# Patient Record
Sex: Female | Born: 1985 | Race: Black or African American | Hispanic: No | Marital: Single | Smoking: Current every day smoker
Health system: Southern US, Community
[De-identification: ages and names within clinical notes are randomized; demographics above are authoritative.]

## PROBLEM LIST (undated history)

## (undated) DIAGNOSIS — F4310 Post-traumatic stress disorder, unspecified: Secondary | ICD-10-CM

## (undated) DIAGNOSIS — F419 Anxiety disorder, unspecified: Secondary | ICD-10-CM

## (undated) DIAGNOSIS — F319 Bipolar disorder, unspecified: Secondary | ICD-10-CM

## (undated) DIAGNOSIS — F209 Schizophrenia, unspecified: Secondary | ICD-10-CM

---

## 2009-11-16 ENCOUNTER — Emergency Department (HOSPITAL_BASED_OUTPATIENT_CLINIC_OR_DEPARTMENT_OTHER): Admission: EM | Admit: 2009-11-16 | Discharge: 2009-11-16 | Payer: Self-pay | Admitting: Emergency Medicine

## 2009-11-16 ENCOUNTER — Ambulatory Visit: Payer: Self-pay | Admitting: Diagnostic Radiology

## 2010-10-25 LAB — BASIC METABOLIC PANEL
BUN: 8 mg/dL (ref 6–23)
Chloride: 104 mEq/L (ref 96–112)
GFR calc Af Amer: >60 mL/min (ref >60)
GFR calc non Af Amer: >60 mL/min (ref >60)
Glucose, Bld: 90 mg/dL (ref 70–99)
Potassium: 4 mEq/L (ref 3.5–5.1)

## 2010-10-25 LAB — URINE MICROSCOPIC-ADD ON

## 2010-10-25 LAB — URINALYSIS, ROUTINE W REFLEX MICROSCOPIC
Bilirubin Urine: NEGATIVE
Nitrite: NEGATIVE
Protein, ur: NEGATIVE mg/dL
pH: 6 (ref 5.0–8.0)

## 2010-10-25 LAB — PREGNANCY, URINE: Preg Test, Ur: NEGATIVE

## 2011-01-16 IMAGING — CR DG CHEST 2V
2 series · 2 of 2 positions shown · non-contrast
Comparison: None

CLINICAL DATA: Insomnia.  Appetite loss.  Diarrhea.

CHEST - 2 VIEW

[w chest pa]
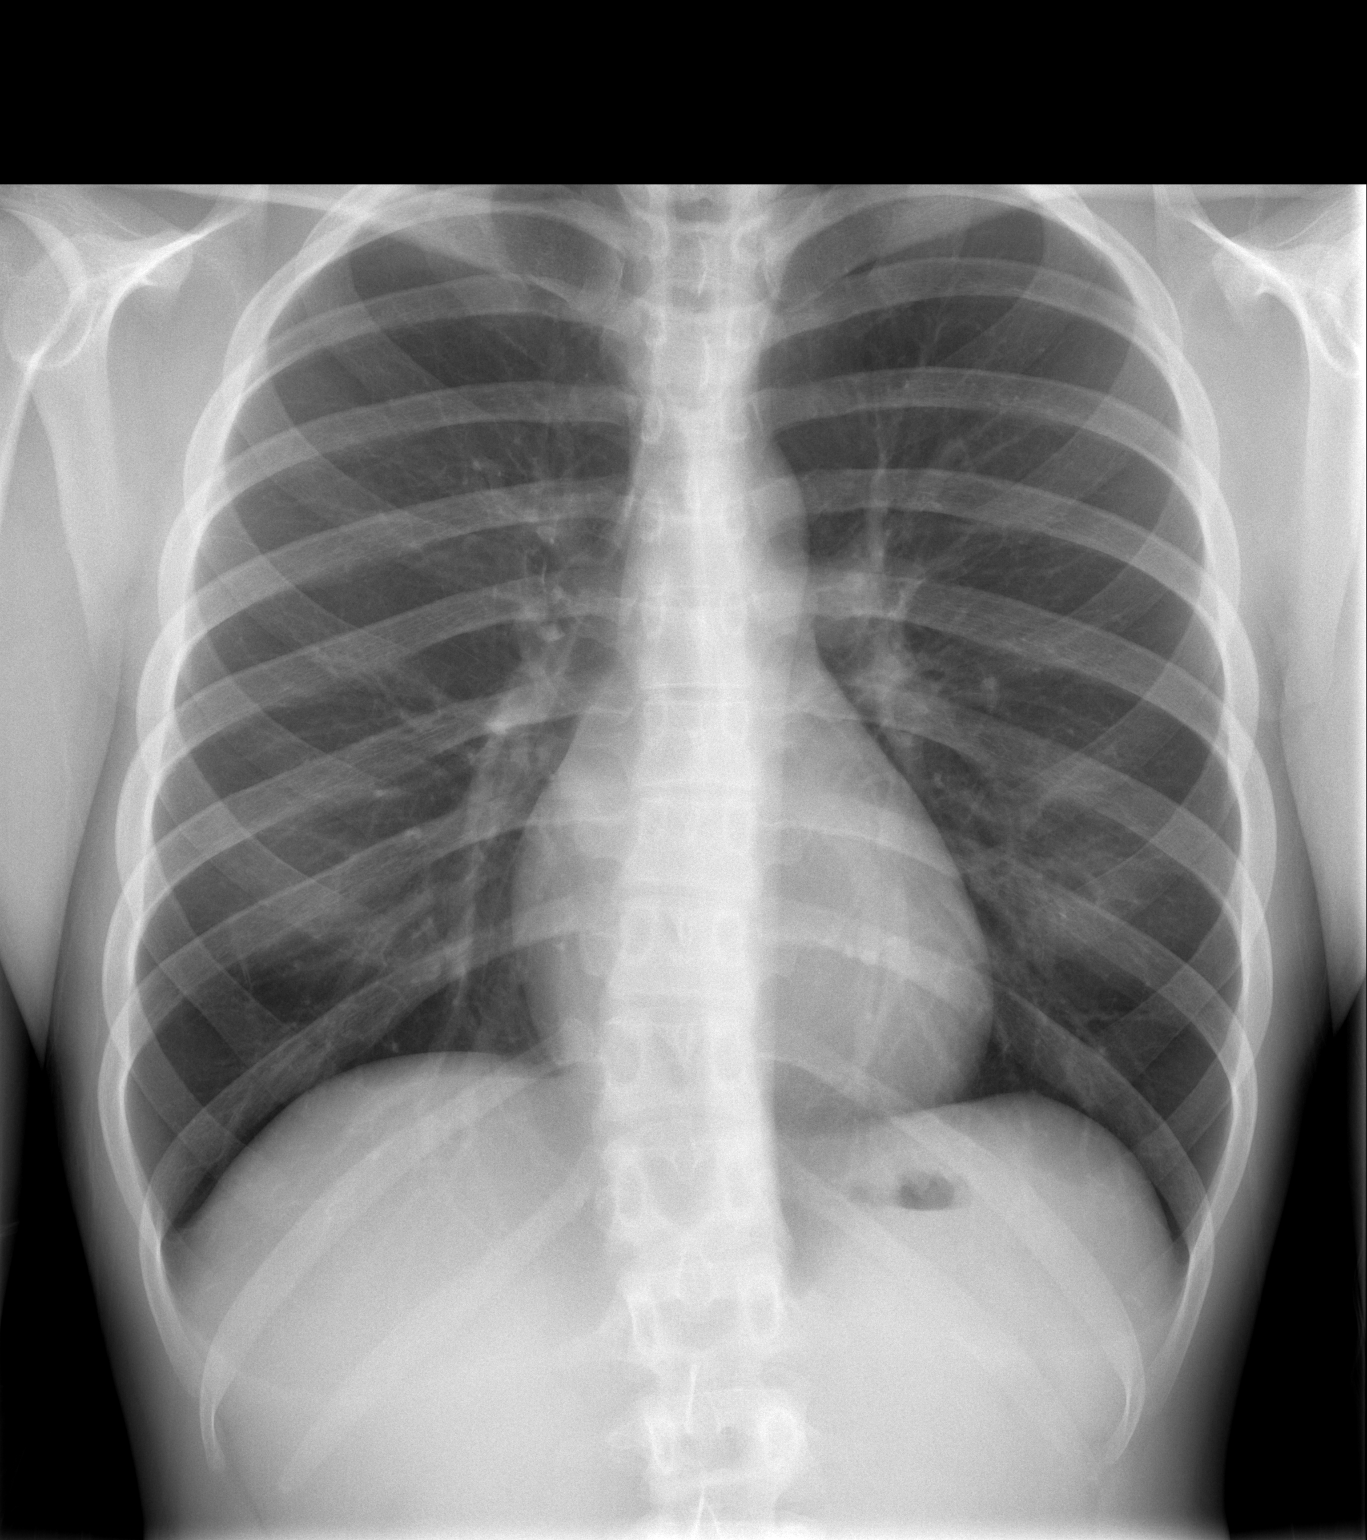

[w chest lat]
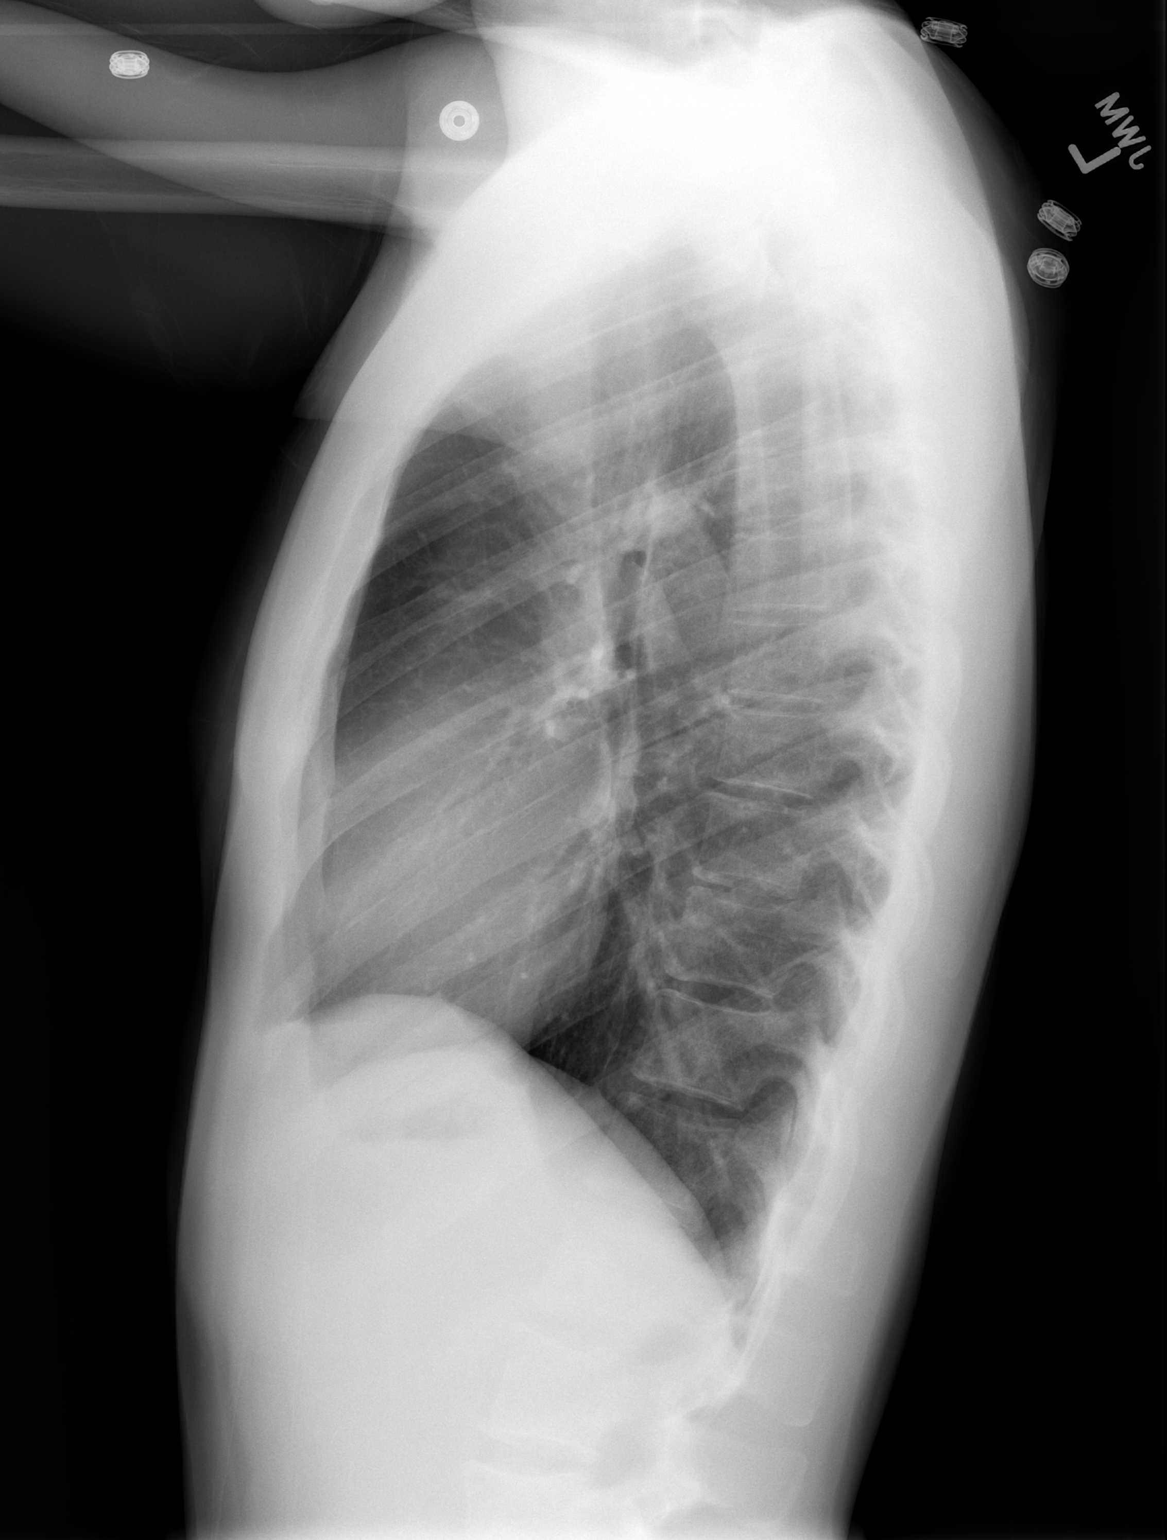

[2 of 2 positions shown; findings below may reference images not displayed]

FINDINGS: Heart size is normal.  Mediastinum is unremarkable except
for mild scoliosis of the spine.  The lungs are clear.  The
vascularity is normal.  No effusions.  No acute bony findings.
IMPRESSION: No cardiovascular or pulmonary pathology evident.  Mild scoliosis.

## 2016-08-09 ENCOUNTER — Inpatient Hospital Stay (HOSPITAL_COMMUNITY)
Admission: AD | Admit: 2016-08-09 | Discharge: 2016-08-13 | DRG: 885 | Disposition: A | Discharge status: Home / Self Care | Payer: Medicaid Other | Attending: Psychiatry | Admitting: Psychiatry

## 2016-08-09 ENCOUNTER — Emergency Department (HOSPITAL_COMMUNITY)
Admission: EM | Admit: 2016-08-09 | Discharge: 2016-08-09 | Disposition: A | Discharge status: Other Acute Inpatient Hospital | Payer: Medicaid Other | Attending: Emergency Medicine | Admitting: Emergency Medicine

## 2016-08-09 ENCOUNTER — Encounter (HOSPITAL_COMMUNITY): Payer: Self-pay | Admitting: Emergency Medicine

## 2016-08-09 ENCOUNTER — Encounter (HOSPITAL_COMMUNITY): Payer: Self-pay

## 2016-08-09 DIAGNOSIS — Z62810 Personal history of physical and sexual abuse in childhood: Secondary | ICD-10-CM | POA: Diagnosis not present

## 2016-08-09 DIAGNOSIS — R440 Auditory hallucinations: Secondary | ICD-10-CM

## 2016-08-09 DIAGNOSIS — F17210 Nicotine dependence, cigarettes, uncomplicated: Secondary | ICD-10-CM | POA: Diagnosis present

## 2016-08-09 DIAGNOSIS — Z5181 Encounter for therapeutic drug level monitoring: Secondary | ICD-10-CM | POA: Insufficient documentation

## 2016-08-09 DIAGNOSIS — F250 Schizoaffective disorder, bipolar type: Secondary | ICD-10-CM | POA: Insufficient documentation

## 2016-08-09 DIAGNOSIS — F29 Unspecified psychosis not due to a substance or known physiological condition: Secondary | ICD-10-CM

## 2016-08-09 DIAGNOSIS — R451 Restlessness and agitation: Secondary | ICD-10-CM | POA: Diagnosis present

## 2016-08-09 DIAGNOSIS — F1220 Cannabis dependence, uncomplicated: Secondary | ICD-10-CM | POA: Diagnosis present

## 2016-08-09 DIAGNOSIS — F411 Generalized anxiety disorder: Secondary | ICD-10-CM | POA: Diagnosis present

## 2016-08-09 DIAGNOSIS — R45851 Suicidal ideations: Secondary | ICD-10-CM | POA: Diagnosis present

## 2016-08-09 DIAGNOSIS — G4700 Insomnia, unspecified: Secondary | ICD-10-CM | POA: Diagnosis present

## 2016-08-09 DIAGNOSIS — Z008 Encounter for other general examination: Secondary | ICD-10-CM

## 2016-08-09 DIAGNOSIS — Z79899 Other long term (current) drug therapy: Secondary | ICD-10-CM | POA: Diagnosis not present

## 2016-08-09 DIAGNOSIS — F22 Delusional disorders: Secondary | ICD-10-CM | POA: Insufficient documentation

## 2016-08-09 HISTORY — DX: Schizophrenia, unspecified: F20.9

## 2016-08-09 HISTORY — DX: Bipolar disorder, unspecified: F31.9

## 2016-08-09 LAB — COMPREHENSIVE METABOLIC PANEL
ALBUMIN: 4.5 g/dL (ref 3.5–5.0)
ALT: 19 U/L (ref 14–54)
ANION GAP: 4 — AB (ref 5–15)
AST: 27 U/L (ref 15–41)
Alkaline Phosphatase: 56 U/L (ref 38–126)
BUN: 8 mg/dL (ref 6–20)
CO2: 27 mmol/L (ref 22–32)
Calcium: 9.2 mg/dL (ref 8.9–10.3)
Chloride: 106 mmol/L (ref 101–111)
Creatinine, Ser: 0.78 mg/dL (ref 0.44–1.00)
GFR calc Af Amer: >60 mL/min (ref >60)
GFR calc non Af Amer: >60 mL/min (ref >60)
GLUCOSE: 106 mg/dL — AB (ref 65–99)
POTASSIUM: 4.1 mmol/L (ref 3.5–5.1)
SODIUM: 137 mmol/L (ref 135–145)
Total Bilirubin: 0.3 mg/dL (ref 0.3–1.2)
Total Protein: 7.8 g/dL (ref 6.5–8.1)

## 2016-08-09 LAB — RAPID URINE DRUG SCREEN, HOSP PERFORMED
AMPHETAMINES: NOT DETECTED
BARBITURATES: NOT DETECTED
BENZODIAZEPINES: NOT DETECTED
COCAINE: NOT DETECTED
Opiates: NOT DETECTED
TETRAHYDROCANNABINOL: POSITIVE — AB

## 2016-08-09 LAB — I-STAT BETA HCG BLOOD, ED (MC, WL, AP ONLY): I-stat hCG, quantitative: <5 m[IU]/mL (ref <5)

## 2016-08-09 LAB — CBC
HEMATOCRIT: 37.9 % (ref 36.0–46.0)
HEMOGLOBIN: 12.8 g/dL (ref 12.0–15.0)
MCH: 29.6 pg (ref 26.0–34.0)
MCHC: 33.8 g/dL (ref 30.0–36.0)
MCV: 87.5 fL (ref 78.0–100.0)
Platelets: 340 10*3/uL (ref 150–400)
RBC: 4.33 MIL/uL (ref 3.87–5.11)
RDW: 13.7 % (ref 11.5–15.5)
WBC: 7 10*3/uL (ref 4.0–10.5)

## 2016-08-09 LAB — ETHANOL: Alcohol, Ethyl (B): <5 mg/dL (ref <5)

## 2016-08-09 LAB — ACETAMINOPHEN LEVEL

## 2016-08-09 LAB — SALICYLATE LEVEL: Salicylate Lvl: <7 mg/dL (ref 2.8–30.0)

## 2016-08-09 MED ORDER — ACETAMINOPHEN 325 MG PO TABS: 650.0000 mg | ORAL_TABLET | Freq: Four times a day (QID) | ORAL | Status: DC | PRN

## 2016-08-09 MED ORDER — CARBAMAZEPINE 200 MG PO TABS
200.0000 mg | ORAL_TABLET | Freq: Two times a day (BID) | ORAL | Status: DC
Start: 2016-08-09 — End: 2016-08-10
  Administered 2016-08-09 – 2016-08-10 (×2): 200 mg via ORAL
  Filled 2016-08-09 (×6): qty 1

## 2016-08-09 MED ORDER — CARBAMAZEPINE 200 MG PO TABS: 200.0000 mg | ORAL_TABLET | Freq: Two times a day (BID) | ORAL | Status: DC

## 2016-08-09 MED ORDER — ZIPRASIDONE MESYLATE 20 MG IM SOLR
20.0000 mg | Freq: Once | INTRAMUSCULAR | Status: AC
  Administered 2016-08-09: 20 mg via INTRAMUSCULAR
  Filled 2016-08-09: qty 20

## 2016-08-09 MED ORDER — DIPHENHYDRAMINE HCL 50 MG/ML IJ SOLN: 50.0000 mg | Freq: Once | INTRAMUSCULAR | Status: DC

## 2016-08-09 MED ORDER — HYDROXYZINE HCL 25 MG PO TABS: 25.0000 mg | ORAL_TABLET | Freq: Three times a day (TID) | ORAL | Status: DC | PRN

## 2016-08-09 MED ORDER — MAGNESIUM HYDROXIDE 400 MG/5ML PO SUSP: 30.0000 mL | Freq: Every day | ORAL | Status: DC | PRN

## 2016-08-09 MED ORDER — OLANZAPINE 5 MG PO TABS: 5.0000 mg | ORAL_TABLET | Freq: Two times a day (BID) | ORAL | Status: DC

## 2016-08-09 MED ORDER — OLANZAPINE 10 MG PO TBDP
10.0000 mg | ORAL_TABLET | Freq: Three times a day (TID) | ORAL | Status: DC | PRN
  Administered 2016-08-10 (×2): 10 mg via ORAL
  Filled 2016-08-09: qty 1

## 2016-08-09 MED ORDER — OLANZAPINE 10 MG PO TBDP: 10.0000 mg | ORAL_TABLET | Freq: Three times a day (TID) | ORAL | Status: DC | PRN

## 2016-08-09 MED ORDER — STERILE WATER FOR INJECTION IJ SOLN
INTRAMUSCULAR | Status: AC
  Filled 2016-08-09: qty 10

## 2016-08-09 MED ORDER — ALUM & MAG HYDROXIDE-SIMETH 200-200-20 MG/5ML PO SUSP: 30.0000 mL | ORAL | Status: DC | PRN

## 2016-08-09 MED ORDER — LORAZEPAM 2 MG/ML IJ SOLN
2.0000 mg | Freq: Once | INTRAMUSCULAR | Status: DC
Start: 2016-08-09 — End: 2016-08-09

## 2016-08-09 MED ORDER — OLANZAPINE 5 MG PO TABS
5.0000 mg | ORAL_TABLET | Freq: Two times a day (BID) | ORAL | Status: DC
  Administered 2016-08-09 – 2016-08-10 (×2): 5 mg via ORAL
  Filled 2016-08-09: qty 1
  Filled 2016-08-09: qty 2
  Filled 2016-08-09 (×3): qty 1

## 2016-08-09 NOTE — Progress Notes (Signed)
08/09/16 1356:  LRT went to pt room to offer activities, pt was sleep.  Caroll RancherMarjette Harshil Cavallaro, LRT/CTRS

## 2016-08-09 NOTE — ED Notes (Signed)
Bed: WA12 Expected date:  Expected time:  Means of arrival:  Comments: IVC 

## 2016-08-09 NOTE — Progress Notes (Signed)
Patient ID: Anderson MaltaQuantita Haughey, female   DOB: 10/18/1985, 31 y.o.   MRN: [id number] D: Client in bed reports "I just want to sleep" "I'm to tired to get up" A: Writer provided emotional support, encouraged client to report any concerns. Client made aware of karaoke group and the use of the dayroom for TV and snacks. Medications reviewed, administered as ordered. Staff will monitor q3415min for safety. R: Client is safe on the unit, refused group, took medications without incidence.

## 2016-08-09 NOTE — ED Notes (Signed)
Report given to TCU RN.  

## 2016-08-09 NOTE — Progress Notes (Signed)
Patient did not attend karaoke group this evening.  

## 2016-08-09 NOTE — ED Notes (Signed)
Per staff, Pt continues to refuse lab draw.  Will notify EDP.

## 2016-08-09 NOTE — ED Notes (Signed)
Pt refusing to have labs drawn at this time.  Shannon PorterMark Lara, EDP made aware.

## 2016-08-09 NOTE — Tx Team (Signed)
Initial Treatment Plan 08/09/2016 7:02 PM Shannon Lara [id number]    PATIENT STRESSORS: Legal issue Medication change or noncompliance   PATIENT STRENGTHS: General fund of knowledge Physical Health   PATIENT IDENTIFIED PROBLEMS: Psychosis  Substance abuse  "Get my own space"  "Sleep better"               DISCHARGE CRITERIA:  Improved stabilization in mood, thinking, and/or behavior Verbal commitment to aftercare and medication compliance Withdrawal symptoms no longer present  PRELIMINARY DISCHARGE PLAN: Outpatient therapy Medication managmenet  PATIENT/FAMILY INVOLVEMENT: This treatment plan has been presented to and reviewed with the patient, Shannon Lara.  The patient and family have been given the opportunity to ask questions and make suggestions.  Levin BaconHeather V Sussan Meter, RN 08/09/2016, 7:02 PM

## 2016-08-09 NOTE — ED Notes (Signed)
[facility].  Left the unit ambulatory with [employer].  Patient was calm and cooperative on awakening and expressed appreciation for being left alone to sleep for a while.  All belongings given to [employer] transporters.

## 2016-08-09 NOTE — ED Notes (Signed)
[facility] Expected date:  Expected time:  Means of arrival:  Comments: 632

## 2016-08-09 NOTE — ED Notes (Signed)
Pt is refusing to have labs drawn. Rn Freida BusmanAllen made aware.

## 2016-08-09 NOTE — BH Assessment (Signed)
[facility] Assessment Progress Note  Per Thedore MinsMojeed Akintayo, MD, this pt requires psychiatric hospitalization.  Berneice Heinrichina Tate, RN, [facility]; they will be ready to receive pt around 14:30.  Pt presents under IVC initiated by EDP Arby BarretteMarcy Pfeiffer, MD, and [facility].  Pt's nurse, Rudean HittDawnaly, has been notified, and agrees to call report to [phone].  Pt is to be transported via [employer].   Doylene Canninghomas Alphonsine Minium, MA Triage Specialist [phone]

## 2016-08-09 NOTE — ED Triage Notes (Signed)
Brought in by [employer] from home for medical clearance and psych evaluation.  Pt's family reported that pt began acting "weird" upon waking up this morning--- pt kept on pacing back and forth in the house, talking loudly and not making any sense.  Pt has hx of bipolar and schizophrenia.  Pt denies hallucinations/delusions.  Pt aggressively and loudly asked, "Do you know who your mother is?" during bedside assessment.
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
## 2016-08-10 ENCOUNTER — Encounter (HOSPITAL_COMMUNITY): Payer: Self-pay | Admitting: Psychiatry

## 2016-08-10 DIAGNOSIS — F1220 Cannabis dependence, uncomplicated: Secondary | ICD-10-CM

## 2016-08-10 DIAGNOSIS — F250 Schizoaffective disorder, bipolar type: Principal | ICD-10-CM

## 2016-08-10 DIAGNOSIS — Z79899 Other long term (current) drug therapy: Secondary | ICD-10-CM

## 2016-08-10 MED ORDER — ARIPIPRAZOLE 10 MG PO TABS
10.0000 mg | ORAL_TABLET | Freq: Every day | ORAL | Status: DC
  Administered 2016-08-10 – 2016-08-12 (×3): 10 mg via ORAL
  Filled 2016-08-10 (×5): qty 1

## 2016-08-10 MED ORDER — OLANZAPINE 10 MG PO TABS: 10.0000 mg | ORAL_TABLET | Freq: Three times a day (TID) | ORAL | Status: DC | PRN

## 2016-08-10 MED ORDER — MENTHOL 3 MG MT LOZG
1.0000 | LOZENGE | OROMUCOSAL | Status: DC | PRN
  Administered 2016-08-10: 3 mg via ORAL

## 2016-08-10 MED ORDER — OLANZAPINE 10 MG IM SOLR: 10.0000 mg | Freq: Three times a day (TID) | INTRAMUSCULAR | Status: DC | PRN

## 2016-08-10 MED ORDER — DIVALPROEX SODIUM 250 MG PO DR TAB
250.0000 mg | DELAYED_RELEASE_TABLET | Freq: Three times a day (TID) | ORAL | Status: DC
  Administered 2016-08-10 – 2016-08-13 (×10): 250 mg via ORAL
  Filled 2016-08-10 (×16): qty 1

## 2016-08-10 NOTE — Tx Team (Signed)
Interdisciplinary Treatment and Diagnostic Plan Update  08/10/2016 Time of Session: 2:27 PM  Shannon Lara MRN: [id number]  Principal Diagnosis: Schizoaffective disorder, bipolar type ([place])  Secondary Diagnoses: Principal Problem:   Schizoaffective disorder, bipolar type ([place]) Active Problems:   Cannabis use disorder, severe, dependence ([place])   Current Medications:  Current Facility-Administered Medications  Medication Dose Route Frequency Provider Last Rate Last Dose  . acetaminophen (TYLENOL) tablet 650 mg  650 mg Oral Q6H PRN Patrecia Pour, NP      . alum & mag hydroxide-simeth (MAALOX/MYLANTA) 200-200-20 MG/5ML suspension 30 mL  30 mL Oral Q4H PRN Patrecia Pour, NP      . ARIPiprazole (ABILIFY) tablet 10 mg  10 mg Oral QHS Saramma Eappen, MD      . divalproex (DEPAKOTE) DR tablet 250 mg  250 mg Oral Q8H Saramma Eappen, MD   250 mg at 08/10/16 1407  . hydrOXYzine (ATARAX/VISTARIL) tablet 25 mg  25 mg Oral TID PRN Patrecia Pour, NP      . magnesium hydroxide (MILK OF MAGNESIA) suspension 30 mL  30 mL Oral Daily PRN Patrecia Pour, NP      . menthol-cetylpyridinium (CEPACOL) lozenge 3 mg  1 lozenge Oral PRN Ursula Alert, MD      . OLANZapine (ZYPREXA) tablet 10 mg  10 mg Oral TID PRN Ursula Alert, MD       Or  . OLANZapine (ZYPREXA) injection 10 mg  10 mg Intramuscular TID PRN Ursula Alert, MD        PTA Medications: Prescriptions Prior to Admission  Medication Sig Dispense Refill Last Dose  . ARIPiprazole ER (ABILIFY MAINTENA) 400 MG SRER Inject 400 mg into the muscle every 28 (twenty-eight) days.   Past Month at Unknown time    Treatment Modalities: Medication Management, Group therapy, Case management,  1 to 1 session with clinician, Psychoeducation, Recreational therapy.   Physician Treatment Plan for Primary Diagnosis: Schizoaffective disorder, bipolar type ([place]) Long Term Goal(s): Improvement in symptoms so as ready for discharge  Short Term Goals: Ability to  identify changes in lifestyle to reduce recurrence of condition will improve Ability to verbalize feelings will improve Ability to demonstrate self-control will improve Ability to identify and develop effective coping behaviors will improve Ability to maintain clinical measurements within normal limits will improve Compliance with prescribed medications will improve Ability to identify triggers associated with substance abuse/mental health issues will improve Ability to identify changes in lifestyle to reduce recurrence of condition will improve Ability to verbalize feelings will improve Ability to demonstrate self-control will improve Ability to identify and develop effective coping behaviors will improve Ability to maintain clinical measurements within normal limits will improve Compliance with prescribed medications will improve Ability to identify triggers associated with substance abuse/mental health issues will improve  Medication Management: Evaluate patient's response, side effects, and tolerance of medication regimen.  Therapeutic Interventions: 1 to 1 sessions, Unit Group sessions and Medication administration.  Evaluation of Outcomes: Progressing  Physician Treatment Plan for Secondary Diagnosis: Principal Problem:   Schizoaffective disorder, bipolar type ([place]) Active Problems:   Cannabis use disorder, severe, dependence ([place])   Long Term Goal(s): Improvement in symptoms so as ready for discharge  Short Term Goals: Ability to identify changes in lifestyle to reduce recurrence of condition will improve Ability to verbalize feelings will improve Ability to demonstrate self-control will improve Ability to identify and develop effective coping behaviors will improve Ability to maintain clinical measurements within normal limits will improve Compliance  with prescribed medications will improve Ability to identify triggers associated with substance abuse/mental health issues  will improve Ability to identify changes in lifestyle to reduce recurrence of condition will improve Ability to verbalize feelings will improve Ability to demonstrate self-control will improve Ability to identify and develop effective coping behaviors will improve Ability to maintain clinical measurements within normal limits will improve Compliance with prescribed medications will improve Ability to identify triggers associated with substance abuse/mental health issues will improve  Medication Management: Evaluate patient's response, side effects, and tolerance of medication regimen.  Therapeutic Interventions: 1 to 1 sessions, Unit Group sessions and Medication administration.  Evaluation of Outcomes: Progressing   RN Treatment Plan for Primary Diagnosis: Schizoaffective disorder, bipolar type ([place]) Long Term Goal(s): Knowledge of disease and therapeutic regimen to maintain health will improve  Short Term Goals: Ability to identify and develop effective coping behaviors will improve and Compliance with prescribed medications will improve  Medication Management: RN will administer medications as ordered by provider, will assess and evaluate patient's response and provide education to patient for prescribed medication. RN will report any adverse and/or side effects to prescribing provider.  Therapeutic Interventions: 1 on 1 counseling sessions, Psychoeducation, Medication administration, Evaluate responses to treatment, Monitor vital signs and CBGs as ordered, Perform/monitor CIWA, COWS, AIMS and Fall Risk screenings as ordered, Perform wound care treatments as ordered.  Evaluation of Outcomes: Progressing   LCSW Treatment Plan for Primary Diagnosis: Schizoaffective disorder, bipolar type ([place]) Long Term Goal(s): Safe transition to appropriate next level of care at discharge, Engage patient in therapeutic group addressing interpersonal concerns.  Short Term Goals: Engage patient in  aftercare planning with referrals and resources  Therapeutic Interventions: Assess for all discharge needs, 1 to 1 time with Social worker, Explore available resources and support systems, Assess for adequacy in community support network, Educate family and significant other(s) on suicide prevention, Complete Psychosocial Assessment, Interpersonal group therapy.  Evaluation of Outcomes: Not Met  See below   Progress in Treatment: Attending groups: Yes Participating in groups: Yes Taking medication as prescribed: Yes Toleration medication: Yes, no side effects reported at this time Family/Significant other contact made:  Patient understands diagnosis: Yes AEB Discussing patient identified problems/goals with staff: Yes Medical problems stabilized or resolved: Yes Denies suicidal/homicidal ideation: Yes Issues/concerns per patient self-inventory: None Other: N/A  New problem(s) identified: None identified at this time.   New Short Term/Long Term Goal(s): None identified at this time.   Discharge Plan or Barriers: states she has no place to stay, and no money until Feb.  Is currently rejecting the idea of going to a shelter.  Reason for Continuation of Hospitalization: Disorganization Hallucinations Paranoia Medication stabilization   Estimated Length of Stay: 3-5 days  Attendees: Patient: 08/10/2016  2:27 PM  Physician: Ursula Alert, MD 08/10/2016  2:27 PM  Nursing: Lauretta Chester, RN 08/10/2016  2:27 PM  RN Care Manager: Lars Pinks, RN 08/10/2016  2:27 PM  Social Worker: Ripley Fraise 08/10/2016  2:27 PM  Recreational Therapist: Laretta Bolster  08/10/2016  2:27 PM  Other: Norberto Sorenson 08/10/2016  2:27 PM  Other:  08/10/2016  2:27 PM    Scribe for Treatment Team:  Roque Lias LCSW 08/10/2016 2:27 PM

## 2016-08-10 NOTE — [facility] Suicide Risk Assessment (Signed)
[facility] INPATIENT:  Family/Significant Other Suicide Prevention Education  Suicide Prevention Education:  Patient Refusal for Family/Significant Other Suicide Prevention Education: The patient Shannon Lara has refused to provide written consent for family/significant other to be provided Family/Significant Other Suicide Prevention Education during admission and/or prior to discharge.  Physician notified.  Pt states she has no one who is helpful to her, wants no contact w any family/friends at this time.  States she is not suicidal, "I have never been."  Sallee Langenne C Bo Teicher 08/10/2016, 9:03 AM
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
## 2016-08-10 NOTE — Progress Notes (Signed)
Patient ID: Shannon MaltaQuantita Lara, female   DOB: [birth year], 31 y.o.   MRN: [id number]  D: Patient isolative to room all evening with little interactions with peers and staff. Pt stated she is doing well. Pt reports plans to talk to SW about placement, does not want to return to home. Pt denies SI/HIAVH and pain. Cooperative with assessment.  A: Medications administered as prescribed. Writer encouraged pt to discuss feelings. Pt encouraged to attend groups and engage in milieu. R: Patient is safe on the unit. She is complaint with medications and denies any adverse reaction.

## 2016-08-10 NOTE — [facility] Group Notes (Signed)
[facility] LCSW Group Therapy  08/10/2016  1:05 PM  Type of Therapy:  Group therapy  Participation Level:  Active  Participation Quality:  Attentive  Affect:  Flat  Cognitive:  Oriented  Insight:  Limited  Engagement in Therapy:  Limited  Modes of Intervention:  Discussion, Socialization  Summary of Progress/Problems:  Chaplain was here to lead a group on themes of hope and courage. Invited.  Chose to not attend. Ida Rogueorth, Kameah Rawl B 08/10/2016 12:37 PM

## 2016-08-10 NOTE — Progress Notes (Signed)
DAR NOTE:  Pt restless on approach, Pt reports "my voice is not my voice." Pt tangential, disorganized, presents with paranoia. Pt denies SI/HI. Pt reports she is wanting to find somewhere else to live. Social work notified. Pt reports the people she lives with are "leaving ejaculation systems everywhere I touch." Pt also reports "I feel that everything I touch turns to gold." Per pt self inventory form pt reports she slept good last night. Pt reports a fair appetite, normal energy level, good concentration. Pt rates depression 0/10, hopelessness 0/10, anxiety 0/10- all on 0-10 scale, 10 being the worse. Pt responds "no" to withdrawing from drugs or alcohol. Pt denies physical problems. Pr reports her goal is "talk to a Child psychotherapistsocial worker" and would like to tell staff to "help me find a place." Encouragement and support provided. Special checks q 15 mins in place for safety. Will continue to monitor.
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
## 2016-08-10 NOTE — Progress Notes (Signed)
Recreation Therapy Notes  Date: 08/10/16 Time: 1000 Location: 500 Hall Dayroom  Group Topic: Stress Management  Goal Area(s) Addresses:  Patient will verbalize importance of using healthy stress management.  Patient will identify positive emotions associated with healthy stress management.   Intervention: Stress Management  Activity :  Progressive Muscle Relaxation, Forest Visualization.  LRT introduced the stress management techniques of progressive muscle relaxation and guided imagery to patients.  LRT read scripts to allow patients to engage and participate in the techniques.  Patients were to follow along as LRT read the scripts to participate.    Education:  Stress Management, Discharge Planning.   Education Outcome: Acknowledges edcuation/In group clarification offered/Needs additional education  Clinical Observations/Feedback:  Pt did not attend group.   Shannon Lara, LRT/CTRS         Shannon Lara A 08/10/2016 12:16 PM 

## 2016-08-10 NOTE — Progress Notes (Signed)
EKG Completed, given to May,NP for review and then placed on chart.

## 2016-08-11 DIAGNOSIS — F17210 Nicotine dependence, cigarettes, uncomplicated: Secondary | ICD-10-CM

## 2016-08-11 LAB — TSH: TSH: 0.252 u[IU]/mL — ABNORMAL LOW (ref 0.350–4.500)

## 2016-08-11 LAB — LIPID PANEL
Cholesterol: 157 mg/dL (ref 0–200)
HDL: 57 mg/dL (ref >40)
LDL CALC: 80 mg/dL (ref 0–99)
Total CHOL/HDL Ratio: 2.8 RATIO
Triglycerides: 99 mg/dL (ref <150)
VLDL: 20 mg/dL (ref 0–40)

## 2016-08-11 NOTE — Progress Notes (Signed)
[facility]  08/11/2016 2:06 PM Shannon MaltaQuantita Lara  MRN:  [id number] Subjective:  Pt states: "I feel better already. I could probably go home soon."  Objective: Pt seen and chart reviewed. Pt is alert/oriented x4, calm, cooperative, and appropriate to situation. Pt denies suicidal/homicidal ideation and psychosis and does not appear to be responding to internal stimuli. Pt reports that she feels much better today and is asking to go home. I informed the pt that we need to observe here for a few days. Pt has poor insight but is willing to participate in treatment.    Principal Problem: Schizoaffective disorder, bipolar type (HCC) Diagnosis:   Patient Active Problem List   Diagnosis Date Noted  . Schizoaffective disorder, bipolar type (HCC) [F25.0] 08/09/2016  . Cannabis use disorder, severe, dependence (HCC) [F12.20] 08/09/2016   Total Time spent with patient: 15 minutes  Past Psychiatric History: see H&P  Past Medical History:  Past Medical History:  Diagnosis Date  . Bipolar 1 disorder (HCC)   . Schizophrenia (HCC)    History reviewed. No pertinent surgical history. Family History: History reviewed. No pertinent family history. Family Psychiatric  History: see H&P Social History:  History  Alcohol Use  . 3.6 oz/week  . 6 Cans of beer per week     History  Drug Use  . Types: Marijuana    Social History   Social History  . Marital status: Single    Spouse name: N/A  . Number of children: N/A  . Years of education: N/A   Social History Main Topics  . Smoking status: Current Every Day Smoker    Packs/day: 1.00    Types: Cigarettes  . Smokeless tobacco: Never Used  . Alcohol use 3.6 oz/week    6 Cans of beer per week  . Drug use:     Types: Marijuana  . Sexual activity: Not Currently   Other Topics Concern  . None   Social History Narrative  . None   Additional Social History:    Pain Medications: SEE MAR Prescriptions: SEE MAR Over the Counter: SEE  MAR History of alcohol / drug use?: Yes Longest period of sobriety (when/how long): unknown Negative Consequences of Use:  (None verbalized) Withdrawal Symptoms: Other (Comment) (No history of withdrawal symptoms) Name of Substance 1: Marijuana 1 - Age of First Use: unknown 1 - Amount (size/oz): 1 gram 1 - Frequency: couple times a month 1 - Duration: unknown 1 - Last Use / Amount: 07/30/16 Name of Substance 2: Alcohol 2 - Age of First Use: unknown 2 - Amount (size/oz): 3 beers 2 - Frequency: two times per week 2 - Duration: unknown 2 - Last Use / Amount: unknown                Sleep: Fair  Appetite:  Good  Current Medications: Current Facility-Administered Medications  Medication Dose Route Frequency Provider Last Rate Last Dose  . acetaminophen (TYLENOL) tablet 650 mg  650 mg Oral Q6H PRN Charm RingsJamison Y Lord, NP      . alum & mag hydroxide-simeth (MAALOX/MYLANTA) 200-200-20 MG/5ML suspension 30 mL  30 mL Oral Q4H PRN Charm RingsJamison Y Lord, NP      . ARIPiprazole (ABILIFY) tablet 10 mg  10 mg Oral QHS Jomarie LongsSaramma Eappen, MD   10 mg at 08/10/16 2329  . divalproex (DEPAKOTE) DR tablet 250 mg  250 mg Oral Q8H Saramma Eappen, MD   250 mg at 08/11/16 1319  . hydrOXYzine (ATARAX/VISTARIL) tablet 25 mg  25 mg Oral TID PRN Charm Rings, NP      . magnesium hydroxide (MILK OF MAGNESIA) suspension 30 mL  30 mL Oral Daily PRN Charm Rings, NP      . menthol-cetylpyridinium (CEPACOL) lozenge 3 mg  1 lozenge Oral PRN Jomarie Longs, MD   3 mg at 08/10/16 1807  . OLANZapine (ZYPREXA) tablet 10 mg  10 mg Oral TID PRN Jomarie Longs, MD       Or  . OLANZapine (ZYPREXA) injection 10 mg  10 mg Intramuscular TID PRN Jomarie Longs, MD        Lab Results:  Results for orders placed or performed during the hospital encounter of 08/09/16 (from the past 48 hour(s))  TSH     Status: Abnormal   Collection Time: 08/11/16  6:52 AM  Result Value Ref Range   TSH 0.252 (L) 0.350 - 4.500 uIU/mL    Comment:  Performed by a 3rd Generation assay with a functional sensitivity of <=0.01 uIU/mL. Performed at [facility]   Lipid panel     Status: None   Collection Time: 08/11/16  6:52 AM  Result Value Ref Range   Cholesterol 157 0 - 200 mg/dL   Triglycerides 99 <161 mg/dL   HDL 57 >09 mg/dL   Total CHOL/HDL Ratio 2.8 RATIO   VLDL 20 0 - 40 mg/dL   LDL Cholesterol 80 0 - 99 mg/dL    Comment:        Total Cholesterol/HDL:CHD Risk Coronary Heart Disease Risk Table                     Men   Women  1/2 Average Risk   3.4   3.3  Average Risk       5.0   4.4  2 X Average Risk   9.6   7.1  3 X Average Risk  23.4   11.0        Use the calculated Patient Ratio above and the CHD Risk Table to determine the patient's CHD Risk.        ATP III CLASSIFICATION (LDL):  <100     mg/dL   Optimal  604-540  mg/dL   Near or Above                    Optimal  130-159  mg/dL   Borderline  981-191  mg/dL   High  >478     mg/dL   Very High Performed at [facility]     Blood Alcohol level:  Lab Results  Component Value Date   [facility] <5 08/09/2016    Metabolic Disorder Labs: No results found for: HGBA1C, MPG No results found for: PROLACTIN Lab Results  Component Value Date   CHOL 157 08/11/2016   TRIG 99 08/11/2016   HDL 57 08/11/2016   CHOLHDL 2.8 08/11/2016   VLDL 20 08/11/2016   LDLCALC 80 08/11/2016    Physical Findings: AIMS: Facial and Oral Movements Muscles of Facial Expression: None, normal Lips and Perioral Area: None, normal Jaw: None, normal Tongue: None, normal,Extremity Movements Upper (arms, wrists, hands, fingers): None, normal Lower (legs, knees, ankles, toes): None, normal, Trunk Movements Neck, shoulders, hips: None, normal, Overall Severity Severity of abnormal movements (highest score from questions above): None, normal Incapacitation due to abnormal movements: None, normal Patient's awareness of abnormal movements (rate only patient's report):  No Awareness, Dental Status Current problems with teeth and/or dentures?: No  Does patient usually wear dentures?: No  CIWA:    COWS:     Musculoskeletal: Strength & Muscle Tone: within normal limits Gait & Station: normal Patient leans: N/A  Psychiatric Specialty Exam: Physical Exam  Review of Systems  Psychiatric/Behavioral: Positive for depression. Negative for hallucinations and suicidal ideas. The patient is nervous/anxious and has insomnia.   All other systems reviewed and are negative.   Blood pressure 109/82, pulse 86, temperature 98 F (36.7 C), temperature source Oral, resp. rate 16, height 5\' 3"  (1.6 m), weight 54.4 kg (120 lb), SpO2 100 %.Body mass index is 21.26 kg/m.  General Appearance: Casual and Fairly Groomed  Eye Contact:  Fair  Speech:  Clear and Coherent and Normal Rate  Volume:  Normal  Mood:  Dysphoric  Affect:  Appropriate and Congruent  Thought Process:  Coherent, Goal Directed, Linear and Descriptions of Associations: Loose  Orientation:  Full (Time, Place, and Person)  Thought Content:  Focused on discharge plans  Suicidal Thoughts:  No  Homicidal Thoughts:  No  Memory:  Immediate;   Fair Recent;   Fair Remote;   Fair  Judgement:  Fair  Insight:  Fair  Psychomotor Activity:  Normal  Concentration:  Concentration: Fair and Attention Span: Fair  Recall:  [employer] of Knowledge:  Fair  Language:  Fair  Akathisia:  No  Handed:    AIMS (if indicated):     Assets:  Communication Skills Desire for Improvement Resilience Social Support Talents/Skills  ADL's:  Intact  Cognition:  WNL  Sleep:  Number of Hours: 6.75   Treatment Plan Summary: Schizoaffective disorder, bipolar type (HCC) unstable, managed as below:  Medications: -Continue Abilify 10mg  po qhs for psychosis -Continue zyprexa 10mg  po/im option tid prn agitation -Continue depakote 250mg  po q8h for mood lability -Continue vistaril 25mg  po tid prn anxiety   Labs:  -Reviewed CBC,  CMP, TSH (0.252), EKG: QTC 378  Beau Fanny, FNP 08/11/2016, 2:06 PM  I agree to notes and plan.

## 2016-08-11 NOTE — Progress Notes (Signed)
Adult Psychoeducational Group Note  Date:  08/11/2016 Time:  2:11 PM  Group Topic/Focus:  Coping With Mental Health Crisis:   The purpose of this group is to help patients identify strategies for coping with mental health crisis.  Group discusses possible causes of crisis and ways to manage them effectively.   Participation Level:  Did Not Attend  Participation Quality:  Altamese Cabalheresa A Syair Fricker 08/11/2016, 2:11 PM

## 2016-08-11 NOTE — [facility] Group Notes (Signed)
[facility] Group Notes: (Clinical Social Work)   08/11/2016      Type of Therapy:  Group Therapy   Participation Level:  Did Not Attend despite MHT prompting   Jude Naclerio Grossman-Orr, LCSW 08/11/2016, 2:56 PM     

## 2016-08-11 NOTE — Progress Notes (Signed)
DAR NOTE: Patient presents with anxious affect and mood.  Denies pain, auditory and visual hallucinations.  Described energy level as normal and concentration as good.  Rates depression at 0, hopelessness at 0, and anxiety at 0.  Maintained on routine safety checks.  Medications given as prescribed.  Support and encouragement offered as needed.  States goal for today is "talk to the doctor."  Patient verbalizes her concern for placement and does not want to return to her previous residence.  Patient concern about her safety and relapsing due to the environment.  Offered no complaint.

## 2016-08-11 NOTE — Progress Notes (Signed)
Did not attend group 

## 2016-08-11 NOTE — Progress Notes (Signed)
Patient ID: Shannon MaltaQuantita Lara, female   DOB: 16-Sep-1985, 31 y.o.   MRN: [id number]  Pt up needing much encouragement to get blood drawn for lab. Pt stated she does not recognize her voice and feels there is something wrong with her vocal cords. Pt reported sleeping for four days and believes she should go to groups today. Pt hyper but cooperative.

## 2016-08-12 LAB — HEMOGLOBIN A1C
HEMOGLOBIN A1C: 5.3 % (ref 4.8–5.6)
Mean Plasma Glucose: 105 mg/dL

## 2016-08-12 NOTE — Progress Notes (Signed)
Patient denies SI, Hi and AVH, but has been seen talking to unseen other.  Patient was noted to be her room reading a paper as if reading to an audience, but there was no one else in her room.   Assess patient for safety, offer medications as prescribed, engage patient in 1:1 staff talks.   Continue to monitor as planned, Patient able to contract for safety.

## 2016-08-12 NOTE — [facility] Group Notes (Signed)
[facility] Group Notes: (Clinical Social Work)   08/12/2016      Type of Therapy:  Group Therapy   Participation Level:  Did Not Attend despite MHT prompting   Sonda Coppens Grossman-Orr, LCSW 08/12/2016, 11:55 AM     

## 2016-08-12 NOTE — Progress Notes (Signed)
[facility] MD Progress Note  08/12/2016 2:05 PM Kathleene Bergemann  MRN:  [id number]   Subjective: Patient reports " I am doing alright, this place is better than my boarding house."  Objective: Brandi Tomlinson is awake, alert and oriented *3. Denies suicidal or homicidal ideation. Denies auditory or visual hallucination. However patient is responding to internal stimuli.Patient reports she is medication compliant without mediation side effects. Patient is hyperactive with pressured speech. Patient denies depression or depressive symptoms. Reports a  good appetite and states she is resting well. Support, encouragement and reassurance was provided.    Principal Problem: Schizoaffective disorder, bipolar type (HCC) Diagnosis:   Patient Active Problem List   Diagnosis Date Noted  . Schizoaffective disorder, bipolar type (HCC) [F25.0] 08/09/2016  . Cannabis use disorder, severe, dependence (HCC) [F12.20] 08/09/2016   Total Time spent with patient: 15 minutes  Past Psychiatric History: see H&P  Past Medical History:  Past Medical History:  Diagnosis Date  . Bipolar 1 disorder (HCC)   . Schizophrenia (HCC)    History reviewed. No pertinent surgical history. Family History: History reviewed. No pertinent family history. Family Psychiatric  History: see H&P Social History:  History  Alcohol Use  . 3.6 oz/week  . 6 Cans of beer per week     History  Drug Use  . Types: Marijuana    Social History   Social History  . Marital status: Single    Spouse name: N/A  . Number of children: N/A  . Years of education: N/A   Social History Main Topics  . Smoking status: Current Every Day Smoker    Packs/day: 1.00    Types: Cigarettes  . Smokeless tobacco: Never Used  . Alcohol use 3.6 oz/week    6 Cans of beer per week  . Drug use:     Types: Marijuana  . Sexual activity: Not Currently   Other Topics Concern  . None   Social History Narrative  . None   Additional Social History:    Pain  Medications: SEE MAR Prescriptions: SEE MAR Over the Counter: SEE MAR History of alcohol / drug use?: Yes Longest period of sobriety (when/how long): unknown Negative Consequences of Use:  (None verbalized) Withdrawal Symptoms: Other (Comment) (No history of withdrawal symptoms) Name of Substance 1: Marijuana 1 - Age of First Use: unknown 1 - Amount (size/oz): 1 gram 1 - Frequency: couple times a month 1 - Duration: unknown 1 - Last Use / Amount: 07/30/16 Name of Substance 2: Alcohol 2 - Age of First Use: unknown 2 - Amount (size/oz): 3 beers 2 - Frequency: two times per week 2 - Duration: unknown 2 - Last Use / Amount: unknown                Sleep: Fair  Appetite:  Good  Current Medications: Current Facility-Administered Medications  Medication Dose Route Frequency Provider Last Rate Last Dose  . acetaminophen (TYLENOL) tablet 650 mg  650 mg Oral Q6H PRN Charm Rings, NP      . alum & mag hydroxide-simeth (MAALOX/MYLANTA) 200-200-20 MG/5ML suspension 30 mL  30 mL Oral Q4H PRN Charm Rings, NP      . ARIPiprazole (ABILIFY) tablet 10 mg  10 mg Oral QHS Jomarie Longs, MD   10 mg at 08/11/16 2315  . divalproex (DEPAKOTE) DR tablet 250 mg  250 mg Oral Q8H Saramma Eappen, MD   250 mg at 08/12/16 4098  . hydrOXYzine (ATARAX/VISTARIL) tablet 25 mg  25 mg  Oral TID PRN Charm Rings, NP      . magnesium hydroxide (MILK OF MAGNESIA) suspension 30 mL  30 mL Oral Daily PRN Charm Rings, NP      . menthol-cetylpyridinium (CEPACOL) lozenge 3 mg  1 lozenge Oral PRN Jomarie Longs, MD   3 mg at 08/10/16 1807  . OLANZapine (ZYPREXA) tablet 10 mg  10 mg Oral TID PRN Jomarie Longs, MD       Or  . OLANZapine (ZYPREXA) injection 10 mg  10 mg Intramuscular TID PRN Jomarie Longs, MD        Lab Results:  Results for orders placed or performed during the hospital encounter of 08/09/16 (from the past 48 hour(s))  TSH     Status: Abnormal   Collection Time: 08/11/16  6:52 AM  Result  Value Ref Range   TSH 0.252 (L) 0.350 - 4.500 uIU/mL    Comment: Performed by a 3rd Generation assay with a functional sensitivity of <=0.01 uIU/mL. Performed at [facility]   Lipid panel     Status: None   Collection Time: 08/11/16  6:52 AM  Result Value Ref Range   Cholesterol 157 0 - 200 mg/dL   Triglycerides 99 <161 mg/dL   HDL 57 >09 mg/dL   Total CHOL/HDL Ratio 2.8 RATIO   VLDL 20 0 - 40 mg/dL   LDL Cholesterol 80 0 - 99 mg/dL    Comment:        Total Cholesterol/HDL:CHD Risk Coronary Heart Disease Risk Table                     Men   Women  1/2 Average Risk   3.4   3.3  Average Risk       5.0   4.4  2 X Average Risk   9.6   7.1  3 X Average Risk  23.4   11.0        Use the calculated Patient Ratio above and the CHD Risk Table to determine the patient's CHD Risk.        ATP III CLASSIFICATION (LDL):  <100     mg/dL   Optimal  604-540  mg/dL   Near or Above                    Optimal  130-159  mg/dL   Borderline  981-191  mg/dL   High  >478     mg/dL   Very High Performed at [facility]     Blood Alcohol level:  Lab Results  Component Value Date   [facility] <5 08/09/2016    Metabolic Disorder Labs: No results found for: HGBA1C, MPG No results found for: PROLACTIN Lab Results  Component Value Date   CHOL 157 08/11/2016   TRIG 99 08/11/2016   HDL 57 08/11/2016   CHOLHDL 2.8 08/11/2016   VLDL 20 08/11/2016   LDLCALC 80 08/11/2016    Physical Findings: AIMS: Facial and Oral Movements Muscles of Facial Expression: None, normal Lips and Perioral Area: None, normal Jaw: None, normal Tongue: None, normal,Extremity Movements Upper (arms, wrists, hands, fingers): None, normal Lower (legs, knees, ankles, toes): None, normal, Trunk Movements Neck, shoulders, hips: None, normal, Overall Severity Severity of abnormal movements (highest score from questions above): None, normal Incapacitation due to abnormal movements: None,  normal Patient's awareness of abnormal movements (rate only patient's report): No Awareness, Dental Status Current problems with teeth and/or dentures?: No Does patient  usually wear dentures?: No  CIWA:    COWS:     Musculoskeletal: Strength & Muscle Tone: within normal limits Gait & Station: normal Patient leans: N/A  Psychiatric Specialty Exam: Physical Exam  Vitals reviewed. Constitutional: She is oriented to person, place, and time. She appears well-developed.  HENT:  Head: Normocephalic.  Neurological: She is alert and oriented to person, place, and time.  Psychiatric: She has a normal mood and affect. Her behavior is normal.    Review of Systems  Psychiatric/Behavioral: Positive for depression. Negative for hallucinations and suicidal ideas. The patient is nervous/anxious and has insomnia.   All other systems reviewed and are negative.   Blood pressure 99/75, pulse 95, temperature 98.8 F (37.1 C), temperature source Oral, resp. rate 18, height 5\' 3"  (1.6 m), weight 54.4 kg (120 lb), SpO2 100 %.Body mass index is 21.26 kg/m.  General Appearance: Casual  Eye Contact:  Fair  Speech:  Clear and Coherent and Pressured  Volume:  Normal  Mood:  Dysphoric  Affect:  Appropriate and Congruent  Thought Process:  Coherent, Goal Directed, Linear and Descriptions of Associations: Loose  Orientation:  Full (Time, Place, and Person)  Thought Content:  Focused on discharge plans  Suicidal Thoughts:  No  Homicidal Thoughts:  No  Memory:  Immediate;   Fair Recent;   Fair Remote;   Fair  Judgement:  Fair  Insight:  Fair  Psychomotor Activity:  Normal  Concentration:  Concentration: Fair and Attention Span: Fair  Recall:  FiservFair  Fund of Knowledge:  Fair  Language:  Fair  Akathisia:  No  Handed:    AIMS (if indicated):     Assets:  Communication Skills Desire for Improvement Resilience Social Support Talents/Skills  ADL's:  Intact  Cognition:  WNL  Sleep:  Number of Hours:  6.75    I agree with current treatment plan on 08/12/2016, Patient seen face-to-face for psychiatric evaluation follow-up, chart reviewed. Reviewed the information documented and agree with the treatment plan.  Treatment Plan Summary: Schizoaffective disorder, bipolar type (HCC) unstable, managed as below: Medications: -Continue Abilify 10mg  po qhs for psychosis -Continue zyprexa 10mg  po/im option tid prn agitation -Continue depakote 250mg  po q8h for mood lability -Continue vistaril 25mg  po tid prn anxiety   Labs:  -Reviewed CBC, CMP, TSH (0.252), EKG: QTC 378 Will continue to monitor vitals ,medication compliance and treatment side effects while patient is here.  CSW will start working on disposition.  Patient to participate in therapeutic milieu  Oneta Rackanika N Lewis, NP 08/12/2016, 2:05 PM  I agree to notes and plan

## 2016-08-13 LAB — PROLACTIN: PROLACTIN: 31.3 ng/mL — AB (ref 4.8–23.3)

## 2016-08-13 MED ORDER — ARIPIPRAZOLE 10 MG PO TABS: 10.0000 mg | ORAL_TABLET | Freq: Every day | ORAL | 0 refills | Status: AC

## 2016-08-13 MED ORDER — BENZOCAINE 10 % MT GEL: Freq: Four times a day (QID) | OROMUCOSAL | Status: DC | PRN

## 2016-08-13 MED ORDER — ARIPIPRAZOLE ER 400 MG IM SRER: 400.0000 mg | INTRAMUSCULAR | 0 refills | Status: AC

## 2016-08-13 MED ORDER — HYDROXYZINE HCL 25 MG PO TABS: 25.0000 mg | ORAL_TABLET | Freq: Three times a day (TID) | ORAL | 0 refills | Status: AC | PRN

## 2016-08-13 MED ORDER — ARIPIPRAZOLE ER 400 MG IM SRER
400.0000 mg | INTRAMUSCULAR | Status: DC
  Administered 2016-08-13: 400 mg via INTRAMUSCULAR

## 2016-08-13 MED ORDER — DIVALPROEX SODIUM 250 MG PO DR TAB: 250.0000 mg | DELAYED_RELEASE_TABLET | Freq: Three times a day (TID) | ORAL | 0 refills | Status: AC

## 2016-08-13 NOTE — [facility] Suicide Risk Assessment (Signed)
[facility]   Principal Problem: Schizoaffective disorder, bipolar type [facility](HCC) Discharge Diagnoses:  Patient Active Problem List   Diagnosis Date Noted  . Schizoaffective disorder, bipolar type (HCC) [F25.0] 08/09/2016  . Cannabis use disorder, severe, dependence (HCC) [F12.20] 08/09/2016    Total Time spent with patient: 30 minutes  Musculoskeletal: Strength & Muscle Tone: within normal limits Gait & Station: normal Patient leans: N/A  Psychiatric Specialty Exam: Review of Systems  Psychiatric/Behavioral: Negative for depression, hallucinations and suicidal ideas. The patient is not nervous/anxious.   All other systems reviewed and are negative.   Blood pressure 105/70, pulse 79, temperature 98 F (36.7 C), temperature source Oral, resp. rate 16, height 5\' 3"  (1.6 m), weight 54.4 kg (120 lb), SpO2 100 %.Body mass index is 21.26 kg/m.  General Appearance: Casual  Eye Contact::  Fair  Speech:  Clear and Coherent409  Volume:  Normal  Mood:  Euthymic  Affect:  Appropriate  Thought Process:  Goal Directed and Descriptions of Associations: Intact  Orientation:  Full (Time, Place, and Person)  Thought Content:  Logical  Suicidal Thoughts:  No  Homicidal Thoughts:  No  Memory:  Immediate;   Fair Recent;   Fair Remote;   Fair  Judgement:  Fair  Insight:  Fair  Psychomotor Activity:  Normal  Concentration:  Fair  Recall:  FiservFair  Fund of Knowledge:Fair  Language: Fair  Akathisia:  No  Handed:  Right  AIMS (if indicated):     Assets:  Communication Skills Desire for Improvement  Sleep:  Number of Hours: 6.75  Cognition: WNL  ADL's:  Intact   Mental Status Per Nursing Assessment::   On Admission:  NA  Demographic Factors:  NA  Loss Factors: Financial problems/change in socioeconomic status  Historical Factors: Impulsivity  Risk Reduction Factors:   Positive social support  Continued Clinical Symptoms:  Previous Psychiatric Diagnoses  and Treatments  Cognitive Features That Contribute To Risk:  None    Suicide Risk:  Minimal: No identifiable suicidal ideation.  Patients presenting with no risk factors but with morbid ruminations; may be classified as minimal risk based on the severity of the depressive symptoms  Follow-up Information    [facility] Follow up on 08/15/2016.   Why:  Wednesday at 9:00 for your hospital follow up appointment.    Contact information: [address], [place] [336] [phone] [id number]          Plan Of Care/Follow-up recommendations:  Activity:  no restrictions Diet:  regular Tests:  depakote level on 08/15/16, t3 t4, tsh needs to be followed up with PMD since TSH is low. Other:  Abilify Maintena IM 400 mg - first ose 08/13/2016  Tegh Franek, MD 08/13/2016, 10:37 AM

## 2016-08-13 NOTE — Progress Notes (Signed)
Recreation Therapy Notes  Date: 08/13/16 Time: 1000 Location: 500 Hall Dayroom  Group Topic: Coping Skills  Goal Area(s) Addresses:  Patients will be able to identify positive coping skills. Patients will be able to identify the benefits of coping skills. Patients will be able to identify how using coping skills will be helpful post d/c.  Behavioral Response: Engaged  Intervention: Dry erase marker, dry erase board, strips of paper with various coping skills   Activity: Coping Skills Pictionary.  Patients were to pick a strip of paper from a can.  Patients were to draw what was on the paper on the board.  The remaining patients were to guess what was being drawn.  The person that guesses the picture, would get the next turn.  Education: PharmacologistCoping Skills, Building control surveyorDischarge Planning.   Education Outcome: Acknowledges understanding/In group clarification offered/Needs additional education.   Clinical Observations/Feedback: Pt stated coping skills "help you maintain and slows you down".  Pt was engaged though out group.  Pt was social with peers.    Caroll RancherMarjette Rmani Kellogg, LRT/CTRS      Caroll RancherLindsay, Wendie Diskin A 08/13/2016 12:44 PM

## 2016-08-13 NOTE — Tx Team (Signed)
Interdisciplinary Treatment and Diagnostic Plan Update  08/13/2016 Time of Session: 10:22 AM  Shannon Lara MRN: [id number]  Principal Diagnosis: Schizoaffective disorder, bipolar type ([place])  Secondary Diagnoses: Principal Problem:   Schizoaffective disorder, bipolar type ([place]) Active Problems:   Cannabis use disorder, severe, dependence ([place])   Current Medications:  Current Facility-Administered Medications  Medication Dose Route Frequency Provider Last Rate Last Dose  . acetaminophen (TYLENOL) tablet 650 mg  650 mg Oral Q6H PRN Patrecia Pour, NP      . alum & mag hydroxide-simeth (MAALOX/MYLANTA) 200-200-20 MG/5ML suspension 30 mL  30 mL Oral Q4H PRN Patrecia Pour, NP      . ARIPiprazole (ABILIFY) tablet 10 mg  10 mg Oral QHS Ursula Alert, MD   10 mg at 08/12/16 2139  . divalproex (DEPAKOTE) DR tablet 250 mg  250 mg Oral Q8H Saramma Eappen, MD   250 mg at 08/13/16 1275  . hydrOXYzine (ATARAX/VISTARIL) tablet 25 mg  25 mg Oral TID PRN Patrecia Pour, NP      . magnesium hydroxide (MILK OF MAGNESIA) suspension 30 mL  30 mL Oral Daily PRN Patrecia Pour, NP      . menthol-cetylpyridinium (CEPACOL) lozenge 3 mg  1 lozenge Oral PRN Ursula Alert, MD   3 mg at 08/10/16 1807  . OLANZapine (ZYPREXA) tablet 10 mg  10 mg Oral TID PRN Ursula Alert, MD       Or  . OLANZapine (ZYPREXA) injection 10 mg  10 mg Intramuscular TID PRN Ursula Alert, MD        PTA Medications: Prescriptions Prior to Admission  Medication Sig Dispense Refill Last Dose  . ARIPiprazole ER (ABILIFY MAINTENA) 400 MG SRER Inject 400 mg into the muscle every 28 (twenty-eight) days.   Past Month at Unknown time    Treatment Modalities: Medication Management, Group therapy, Case management,  1 to 1 session with clinician, Psychoeducation, Recreational therapy.   Physician Treatment Plan for Primary Diagnosis: Schizoaffective disorder, bipolar type ([place]) Long Term Goal(s): Improvement in symptoms so as ready for  discharge  Short Term Goals: Ability to identify changes in lifestyle to reduce recurrence of condition will improve Ability to verbalize feelings will improve Ability to demonstrate self-control will improve Ability to identify and develop effective coping behaviors will improve Ability to maintain clinical measurements within normal limits will improve Compliance with prescribed medications will improve Ability to identify triggers associated with substance abuse/mental health issues will improve Ability to identify changes in lifestyle to reduce recurrence of condition will improve Ability to verbalize feelings will improve Ability to demonstrate self-control will improve Ability to identify and develop effective coping behaviors will improve Ability to maintain clinical measurements within normal limits will improve Compliance with prescribed medications will improve Ability to identify triggers associated with substance abuse/mental health issues will improve  Medication Management: Evaluate patient's response, side effects, and tolerance of medication regimen.  Therapeutic Interventions: 1 to 1 sessions, Unit Group sessions and Medication administration.  Evaluation of Outcomes: Adequate for Discharge  Physician Treatment Plan for Secondary Diagnosis: Principal Problem:   Schizoaffective disorder, bipolar type ([place]) Active Problems:   Cannabis use disorder, severe, dependence ([place])   Long Term Goal(s): Improvement in symptoms so as ready for discharge  Short Term Goals: Ability to identify changes in lifestyle to reduce recurrence of condition will improve Ability to verbalize feelings will improve Ability to demonstrate self-control will improve Ability to identify and develop effective coping behaviors will improve Ability to maintain  clinical measurements within normal limits will improve Compliance with prescribed medications will improve Ability to identify triggers  associated with substance abuse/mental health issues will improve Ability to identify changes in lifestyle to reduce recurrence of condition will improve Ability to verbalize feelings will improve Ability to demonstrate self-control will improve Ability to identify and develop effective coping behaviors will improve Ability to maintain clinical measurements within normal limits will improve Compliance with prescribed medications will improve Ability to identify triggers associated with substance abuse/mental health issues will improve  Medication Management: Evaluate patient's response, side effects, and tolerance of medication regimen.  Therapeutic Interventions: 1 to 1 sessions, Unit Group sessions and Medication administration.  Evaluation of Outcomes: Adequate for Discharge   RN Treatment Plan for Primary Diagnosis: Schizoaffective disorder, bipolar type ([place]) Long Term Goal(s): Knowledge of disease and therapeutic regimen to maintain health will improve  Short Term Goals: Ability to identify and develop effective coping behaviors will improve and Compliance with prescribed medications will improve  Medication Management: RN will administer medications as ordered by provider, will assess and evaluate patient's response and provide education to patient for prescribed medication. RN will report any adverse and/or side effects to prescribing provider.  Therapeutic Interventions: 1 on 1 counseling sessions, Psychoeducation, Medication administration, Evaluate responses to treatment, Monitor vital signs and CBGs as ordered, Perform/monitor CIWA, COWS, AIMS and Fall Risk screenings as ordered, Perform wound care treatments as ordered.  Evaluation of Outcomes: Adequate for Discharge   LCSW Treatment Plan for Primary Diagnosis: Schizoaffective disorder, bipolar type ([place]) Long Term Goal(s): Safe transition to appropriate next level of care at discharge, Engage patient in therapeutic group  addressing interpersonal concerns.  Short Term Goals: Engage patient in aftercare planning with referrals and resources  Therapeutic Interventions: Assess for all discharge needs, 1 to 1 time with Social worker, Explore available resources and support systems, Assess for adequacy in community support network, Educate family and significant other(s) on suicide prevention, Complete Psychosocial Assessment, Interpersonal group therapy.  Evaluation of Outcomes: Met  See below   Progress in Treatment: Attending groups: Yes Participating in groups: Yes Taking medication as prescribed: Yes Toleration medication: Yes, no side effects reported at this time Family/Significant other contact made:  Patient understands diagnosis: Yes AEB Discussing patient identified problems/goals with staff: Yes Medical problems stabilized or resolved: Yes Denies suicidal/homicidal ideation: Yes Issues/concerns per patient self-inventory: None Other: N/A  New problem(s) identified: None identified at this time.   New Short Term/Long Term Goal(s): None identified at this time.   Discharge Plan or Barriers: states she has no place to stay, and no money until Feb.  Is currently rejecting the idea of going to a shelter. 1/8:  Pt OK with going to [facility] in [place], following up at [facility] for Continuation of Hospitalization:    Estimated Length of Stay: D/C today  Attendees: Patient: 08/13/2016  10:22 AM  Physician: Ursula Alert, MD 08/13/2016  10:22 AM  Nursing: Lauretta Chester, RN 08/13/2016  10:22 AM  RN Care Manager: Lars Pinks, RN 08/13/2016  10:22 AM  Social Worker: Ripley Fraise 08/13/2016  10:22 AM  Recreational Therapist: Laretta Bolster  08/13/2016  10:22 AM  Other: Norberto Sorenson 08/13/2016  10:22 AM  Other:  08/13/2016  10:22 AM    Scribe for Treatment Team:  Roque Lias LCSW 08/13/2016 10:22 AM

## 2016-08-13 NOTE — Progress Notes (Signed)
Recreation Therapy Notes  INPATIENT RECREATION THERAPY ASSESSMENT  Patient Details Name: Anderson MaltaQuantita Bellavance MRN: [id number] DOB: 12/15/1985 Today's Date: 08/13/2016  Patient Stressors: Other (Comment) (People taking advantage of me)  Pt stated she was here for lack of sleep.  Coping Skills:   Isolate, Avoidance, Art/Dance, Talking, Music  Personal Challenges: Anger, Communication, Concentration, Relationships, Stress Management, Trusting Others  Leisure Interests (2+):  Individual - Writing, Individual - Other (Comment) Holiday representative(Poetry)  Awareness of Community Resources:  Yes  Community Resources:  Library, [place]  Current Use: Yes  Patient Strengths:  Telling the truth  Patient Identified Areas of Improvement:  Expressing myself better  Current Recreation Participation:  3 times a month  Patient Goal for Hospitalization:  "Understand myself better"  [place]  [place]  Current [place] (including self-harm):  No  Current HI:  No  Consent to Intern Participation: N/A   Caroll RancherMarjette Reilyn Nelson, LRT/CTRS  Lillia AbedLindsay, Demri Poulton A 08/13/2016, 1:20 PM

## 2016-08-13 NOTE — [facility] Suicide Risk Assessment (Signed)
[facility] INPATIENT:  Family/Significant Other Suicide Prevention Education  Suicide Prevention Education:  Education Completed; No one has been identified by the patient as the family member/significant other with whom the patient will be residing, and identified as the person(s) who will aid the patient in the event of a mental health crisis (suicidal ideations/suicide attempt).  With written consent from the patient, the family member/significant other has been provided the following suicide prevention education, prior to the and/or following the discharge of the patient.  The suicide prevention education provided includes the following:  Suicide risk factors  Suicide prevention and interventions  National Suicide Hotline telephone number  [facility] assessment telephone number  [facility] 911  [facility] and/or [facility]idential [facility] telephone number  Request made of family/significant other to:  Remove weapons (e.g., guns, rifles, knives), all items previously/currently identified as safety concern.    Remove drugs/medications (over-the-counter, prescriptions, illicit drugs), all items previously/currently identified as a safety concern.  The family member/significant other verbalizes understanding of the suicide prevention education information provided.  The family member/significant other agrees to remove the items of safety concern listed above. The patient did not endorse SI at the time of admission, nor did the patient c/o SI during the stay here.  SPE not required.  Shannon RogueRodney Lara Shannon Lara 08/13/2016, 8:45 AM

## 2016-08-13 NOTE — Progress Notes (Signed)
  D: Pt was laying in bed prior to the assessment. When writer entered the room and called her name pt appeared visibly irritated. Pt lifted her head and looked away from the writer. When asked about her day pt didn't answer immediately. After being asked again, pt stated, "alright".  When asked if she had any questions or concerns that she needed to discuss with the writer pt stated, "A bunch of interpersonal stuff", then said "i'm alright". However, before writer could leave the room pt smiled and said "you're ok". Pt has no questions or concerns.    A:  Support and encouragement was offered. 15 min checks continued for safety.  R: Pt remains safe.

## 2016-08-13 NOTE — Progress Notes (Signed)
D: Pt A & O X4. Denies SI, HI, AVH and pain at this time.  Pt presents anxious, fidgety and animated but cooperative with care. Pt attended scheduled unit groups and was engaged. Tearful at intervals "I need someone to talk to when I leave, I do have a ACT team but I do everything on my own, I don't see them like that". Pt encouraged to speak with provider prior to and after d/c at time of follow up appointment. Pt d/c home as ordered.  A: Scheduled medications administered as prescribed. D/C instructions reviewed with pt including follow up appointments and prescriptions. All belongings from locker 47 returned to pt. Encouragement and support provided to pt. Q 15 minutes safety checks maintained on and off unit till time of d/c without incidents. R: Pt receptive to care. Compliant with medications as ordered. Denies adverse drug reactions when assessed. Verbalized understanding related to d/c instructions. Signed belonging sheet in agreement with items received. Denies concerns at this time. Ambulatory with a steady gait. No acute distress evident at time of d/c.

## 2016-08-13 NOTE — Progress Notes (Signed)
  [facility] :  Will you be returning to the same living situation after discharge:  No. At discharge, do you have transportation home?: Yes,  bus pass, PART money Do you have the ability to pay for your medications: Yes,  MCD, mental health  Release of information consent forms completed and in the chart;  Patient's signature needed at discharge.  Patient to Follow up at: Follow-up Information    [facility] Follow up on 08/15/2016.   Why:  Wednesday at 9:00 for your hospital follow up appointment.    Contact information: [address], [place] [phone]          Next level of care provider has access to [facility] Link:no  Safety Planning and Suicide Prevention discussed: Yes,  yes  Have you used any form of tobacco in the last 30 days? (Cigarettes, Smokeless Tobacco, Cigars, and/or Pipes): Yes  Has patient been referred to the Quitline?: Patient refused referral  Patient has been referred for addiction treatment: Pt. refused referral  Ida RogueRodney B Ysabel Stankovich 08/13/2016, 10:26 AM
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
## 2016-09-27 ENCOUNTER — Encounter (HOSPITAL_COMMUNITY): Payer: Self-pay | Admitting: Emergency Medicine

## 2016-09-27 ENCOUNTER — Emergency Department (HOSPITAL_COMMUNITY)
Admission: EM | Admit: 2016-09-27 | Discharge: 2016-09-27 | Disposition: A | Discharge status: Home / Self Care | Payer: Medicaid Other | Attending: Dermatology | Admitting: Dermatology

## 2016-09-27 DIAGNOSIS — R51 Headache: Secondary | ICD-10-CM | POA: Diagnosis not present

## 2016-09-27 DIAGNOSIS — Z5321 Procedure and treatment not carried out due to patient leaving prior to being seen by health care provider: Secondary | ICD-10-CM | POA: Insufficient documentation

## 2016-09-27 HISTORY — DX: Anxiety disorder, unspecified: F41.9

## 2016-09-27 HISTORY — DX: Post-traumatic stress disorder, unspecified: F43.10

## 2016-09-27 NOTE — ED Notes (Signed)
Patent very angry, yelling at staff, demanding to go outside to smoke, pt told she could not leave to smoke. Patient continued to yell in hallway upsetting other patients. Patient escorted out by security.

## 2016-09-27 NOTE — ED Notes (Signed)
Patient is a/o. Having jerking/muscle movements in legs and arms. Patient states "im not controlling it"

## 2016-09-27 NOTE — ED Notes (Signed)
Patient screaming and yelling at staff-verbally abusive to staff and threatening in posture-patient escorted out by security due to behavior.
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
## 2024-08-03 ENCOUNTER — Encounter: Payer: Self-pay | Admitting: Physician Assistant

## 2024-08-03 NOTE — Progress Notes (Signed)
 Pt c/o blurry vision. It was sudden onset and happened while she was watching TV.   She is not having double vision, ROM is good. Eye exam w/ fuzzy retina, o/w normal.   Vision is 20/25 on the R and 20/40 on the left.  She has never needed glasses in the past.   No other illnesses or problems.  She is bipolar and is compliant w/ her monthly Abilify  injection.   Shona Shad, PA-C 08/03/2024 10:49 AM
